# Patient Record
Sex: Male | Born: 1945 | Marital: Married | Smoking: Former smoker
Health system: Southern US, Community
[De-identification: ages and names within clinical notes are randomized; demographics above are authoritative.]

## PROBLEM LIST (undated history)

## (undated) DIAGNOSIS — E785 Hyperlipidemia, unspecified: Secondary | ICD-10-CM

## (undated) DIAGNOSIS — I519 Heart disease, unspecified: Secondary | ICD-10-CM

## (undated) DIAGNOSIS — I10 Essential (primary) hypertension: Secondary | ICD-10-CM

## (undated) HISTORY — PX: ELBOW SURGERY: SHX618

## (undated) HISTORY — PX: ANKLE SURGERY: SHX546

## (undated) HISTORY — DX: Heart disease, unspecified: I51.9

## (undated) HISTORY — DX: Essential (primary) hypertension: I10

## (undated) HISTORY — DX: Hyperlipidemia, unspecified: E78.5

---

## 2001-02-15 HISTORY — PX: ANGIOPLASTY: SHX39

## 2012-06-29 LAB — LIPID PANEL: Triglycerides: 88 mg/dL (ref 40–160)

## 2012-07-14 ENCOUNTER — Ambulatory Visit (INDEPENDENT_AMBULATORY_CARE_PROVIDER_SITE_OTHER): Payer: BC Managed Care – PPO | Admitting: Sports Medicine

## 2012-07-14 ENCOUNTER — Encounter: Payer: Self-pay | Admitting: Sports Medicine

## 2012-07-14 ENCOUNTER — Ambulatory Visit (INDEPENDENT_AMBULATORY_CARE_PROVIDER_SITE_OTHER): Payer: BC Managed Care – PPO

## 2012-07-14 VITALS — BP 127/82 | HR 71 | Wt 186.0 lb

## 2012-07-14 DIAGNOSIS — R7401 Elevation of levels of liver transaminase levels: Secondary | ICD-10-CM

## 2012-07-14 DIAGNOSIS — Z299 Encounter for prophylactic measures, unspecified: Secondary | ICD-10-CM | POA: Insufficient documentation

## 2012-07-14 DIAGNOSIS — M542 Cervicalgia: Secondary | ICD-10-CM

## 2012-07-14 DIAGNOSIS — R7402 Elevation of levels of lactic acid dehydrogenase (LDH): Secondary | ICD-10-CM

## 2012-07-14 DIAGNOSIS — E785 Hyperlipidemia, unspecified: Secondary | ICD-10-CM

## 2012-07-14 DIAGNOSIS — Z9861 Coronary angioplasty status: Secondary | ICD-10-CM

## 2012-07-14 DIAGNOSIS — Z955 Presence of coronary angioplasty implant and graft: Secondary | ICD-10-CM | POA: Insufficient documentation

## 2012-07-14 LAB — COMPREHENSIVE METABOLIC PANEL
ALT: 49 U/L (ref 0–53)
Albumin: 4.5 g/dL (ref 3.5–5.2)
CO2: 25 mEq/L (ref 19–32)
Calcium: 9.3 mg/dL (ref 8.4–10.5)
Chloride: 105 mEq/L (ref 96–112)
Glucose, Bld: 74 mg/dL (ref 70–99)
Potassium: 4.5 mEq/L (ref 3.5–5.3)
Sodium: 138 mEq/L (ref 135–145)
Total Bilirubin: 0.5 mg/dL (ref 0.3–1.2)
Total Protein: 7.1 g/dL (ref 6.0–8.3)

## 2012-07-14 LAB — CBC
HCT: 41.8 % (ref 39.0–52.0)
Hemoglobin: 14.5 g/dL (ref 13.0–17.0)
MCH: 31.5 pg (ref 26.0–34.0)
MCHC: 34.7 g/dL (ref 30.0–36.0)
MCV: 90.7 fL (ref 78.0–100.0)
Platelets: 207 K/uL (ref 150–400)
RBC: 4.61 MIL/uL (ref 4.22–5.81)
RDW: 13 % (ref 11.5–15.5)
WBC: 5.5 10*3/uL (ref 4.0–10.5)

## 2012-07-14 LAB — HEMOGLOBIN A1C
Hgb A1c MFr Bld: 5.8 % — ABNORMAL HIGH (ref <5.7)
Mean Plasma Glucose: 120 mg/dL — ABNORMAL HIGH (ref <117)

## 2012-07-14 LAB — COMPREHENSIVE METABOLIC PANEL WITH GFR
AST: 63 U/L — ABNORMAL HIGH (ref 0–37)
Alkaline Phosphatase: 60 U/L (ref 39–117)
BUN: 19 mg/dL (ref 6–23)
Creat: 1.14 mg/dL (ref 0.50–1.35)

## 2012-07-14 LAB — TSH: TSH: 1.632 u[IU]/mL (ref 0.350–4.500)

## 2012-07-14 MED ORDER — PREDNISONE 50 MG PO TABS: ORAL_TABLET | ORAL | Status: DC

## 2012-07-14 MED ORDER — CYCLOBENZAPRINE HCL 10 MG PO TABS: ORAL_TABLET | ORAL | Status: DC

## 2012-07-14 MED ORDER — MELOXICAM 15 MG PO TABS: ORAL_TABLET | ORAL | Status: AC

## 2012-07-14 NOTE — Progress Notes (Signed)
  Subjective:    CC: Establish care.   HPI:  Neck pain: Present for 6 months after lifting weights, pain is localized in the right side of the mid cervical spine, radiating to the right upper shoulder. Pain is worse with flexion, and does wake him up at night. He denies any bowel or bladder dysfunction. Denies any numbness and tingling in the hands or fingers. Symptoms are moderate, stable. Has not tried anything but ibuprofen.  Hyperlipidemia: Doing well with Crestor.  Transaminitis: Approximately 2 weeks ago, noted a small increase in his ALT and AST.  Past medical history, Surgical history, Family history not pertinant except as noted below, Social history, Allergies, and medications have been entered into the medical record, reviewed, and no changes needed.   Review of Systems: No headache, visual changes, nausea, vomiting, diarrhea, constipation, dizziness, abdominal pain, skin rash, fevers, chills, night sweats, swollen lymph nodes, weight loss, chest pain, body aches, joint swelling, muscle aches, shortness of breath, mood changes, visual or auditory hallucinations.  Objective:    General: Well Developed, well nourished, and in no acute distress.  Neuro: Alert and oriented x3, extra-ocular muscles intact, sensation grossly intact.  HEENT: Normocephalic, atraumatic, pupils equal round reactive to light, neck supple, no masses, no lymphadenopathy, thyroid nonpalpable.  Skin: Warm and dry, no rashes noted.  Cardiac: Regular rate and rhythm, no murmurs rubs or gallops.  Respiratory: Clear to auscultation bilaterally. Not using accessory muscles, speaking in full sentences.  Abdominal: Soft, nontender, nondistended, positive bowel sounds, no masses, no organomegaly.  Neck: Inspection unremarkable. No palpable stepoffs. Spurling's maneuver is painful but does not reproduce any radicular symptoms. Full neck range of motion Grip strength and sensation normal in bilateral hands Strength  good C4 to T1 distribution No sensory change to C4 to T1 Negative Hoffman sign bilaterally Reflexes normal Impression and Recommendations:    The patient was counselled, risk factors were discussed, anticipatory guidance given.

## 2012-07-14 NOTE — Assessment & Plan Note (Signed)
Likely discogenic after an episode of lifting weights approximately 6 months ago. He does radiate to the right upper shoulder but not past. Prednisone, home exercises, x-rays, Flexeril at night. Mobic. Return in 4 weeks, MRI if no better.

## 2012-07-14 NOTE — Assessment & Plan Note (Signed)
Continue Crestor, most recent lipids were completely well controlled.

## 2012-07-14 NOTE — Assessment & Plan Note (Signed)
Checking some routine blood work. He will schedule a preventive visit.

## 2012-07-14 NOTE — Assessment & Plan Note (Signed)
Rechecking LFTs

## 2012-07-15 LAB — VITAMIN D 25 HYDROXY (VIT D DEFICIENCY, FRACTURES): Vit D, 25-Hydroxy: 45 ng/mL (ref 30–89)

## 2012-07-17 LAB — TESTOSTERONE, FREE, TOTAL, SHBG
Sex Hormone Binding: 39 nmol/L (ref 13–71)
Testosterone, Free: 60.1 pg/mL (ref 47.0–244.0)
Testosterone-% Free: 1.8 % (ref 1.6–2.9)
Testosterone: 335 ng/dL (ref 300–890)

## 2012-07-17 NOTE — Addendum Note (Signed)
Addended by: Monica Becton on: 07/17/2012 10:49 AM   Modules accepted: Orders

## 2012-07-18 ENCOUNTER — Encounter: Payer: Self-pay | Admitting: *Deleted

## 2012-07-18 LAB — HEPATITIS PANEL, ACUTE
HCV Ab: NEGATIVE
Hep A IgM: NEGATIVE
Hep B C IgM: NEGATIVE
Hepatitis B Surface Ag: NEGATIVE

## 2012-07-27 ENCOUNTER — Encounter: Payer: Self-pay | Admitting: Sports Medicine

## 2012-07-27 ENCOUNTER — Ambulatory Visit (INDEPENDENT_AMBULATORY_CARE_PROVIDER_SITE_OTHER): Payer: BC Managed Care – PPO | Admitting: Sports Medicine

## 2012-07-27 VITALS — BP 123/84 | HR 67 | Wt 184.0 lb

## 2012-07-27 DIAGNOSIS — Z955 Presence of coronary angioplasty implant and graft: Secondary | ICD-10-CM

## 2012-07-27 DIAGNOSIS — Z299 Encounter for prophylactic measures, unspecified: Secondary | ICD-10-CM

## 2012-07-27 DIAGNOSIS — M542 Cervicalgia: Secondary | ICD-10-CM

## 2012-07-27 DIAGNOSIS — Z23 Encounter for immunization: Secondary | ICD-10-CM

## 2012-07-27 DIAGNOSIS — Z0000 Encounter for general adult medical examination without abnormal findings: Secondary | ICD-10-CM

## 2012-07-27 DIAGNOSIS — R7401 Elevation of levels of liver transaminase levels: Secondary | ICD-10-CM

## 2012-07-27 NOTE — Assessment & Plan Note (Signed)
We'll be following up in approximately 2 weeks for cervical degenerative disc disease.

## 2012-07-27 NOTE — Assessment & Plan Note (Signed)
He will followup with his cardiologist in [facility] regarding further evaluation of this. He is asymptomatic.

## 2012-07-27 NOTE — Assessment & Plan Note (Signed)
Is likely due to Crestor. Clinically asymptomatic we will recheck his liver function approximately 6 months.

## 2012-07-27 NOTE — Assessment & Plan Note (Signed)
Complete physical performed today, Hemoccult was negative. Needs referral to colonoscopy.

## 2012-07-27 NOTE — Progress Notes (Signed)
  Subjective:    CC: Establish care.   HPI:  Due for a colonoscopy, his last one was 3 years ago, polyps were seen, and he needs 3 her colonoscopies.  Due for a Pneumovax, unsure of when his last tetanus shot was.  Transaminitis: Completely asymptomatic, no nausea, yellowing of eyes, right upper quadrant pain.  Neck pain: Still has a couple more weeks of conservative measures. Overall doing much better.  Hyperlipidemia: Stable on Crestor.  Coronary artery disease status post stent placement: Does have a followup coming up with his cardiologist, no chest pain, no palpitations, no dyspnea.  Past medical history, Surgical history, Family history not pertinant except as noted below, Social history, Allergies, and medications have been entered into the medical record, reviewed, and no changes needed.   Review of Systems: No headache, visual changes, nausea, vomiting, diarrhea, constipation, dizziness, abdominal pain, skin rash, fevers, chills, night sweats, swollen lymph nodes, weight loss, chest pain, body aches, joint swelling, muscle aches, shortness of breath, mood changes, visual or auditory hallucinations.  Objective:    General: Well Developed, well nourished, and in no acute distress.  Neuro: Alert and oriented x3, extra-ocular muscles intact, sensation grossly intact.  HEENT: Normocephalic, atraumatic, pupils equal round reactive to light, neck supple, no masses, no lymphadenopathy, thyroid nonpalpable.  Skin: Warm and dry, no rashes noted.  Cardiac: Regular rate and rhythm, no murmurs rubs or gallops.  Respiratory: Clear to auscultation bilaterally. Not using accessory muscles, speaking in full sentences.  Abdominal: Soft, nontender, nondistended, positive bowel sounds, no masses, no organomegaly.  Musculoskeletal: Shoulder, elbow, wrist, hip, knee, ankle stable, and with full range of motion. Rectal: Good tone, prostate was smooth, Hemoccult negative. Impression and  Recommendations:    The patient was counselled, risk factors were discussed, anticipatory guidance given.

## 2012-08-10 ENCOUNTER — Ambulatory Visit (INDEPENDENT_AMBULATORY_CARE_PROVIDER_SITE_OTHER): Payer: BC Managed Care – PPO | Admitting: Sports Medicine

## 2012-08-10 ENCOUNTER — Encounter: Payer: Self-pay | Admitting: Internal Medicine

## 2012-08-10 ENCOUNTER — Encounter: Payer: Self-pay | Admitting: Sports Medicine

## 2012-08-10 VITALS — BP 140/89 | HR 70 | Wt 184.0 lb

## 2012-08-10 DIAGNOSIS — Z299 Encounter for prophylactic measures, unspecified: Secondary | ICD-10-CM

## 2012-08-10 DIAGNOSIS — M542 Cervicalgia: Secondary | ICD-10-CM

## 2012-08-10 MED ORDER — TRAMADOL HCL 50 MG PO TABS: 50.0000 mg | ORAL_TABLET | Freq: Three times a day (TID) | ORAL | Status: AC | PRN

## 2012-08-10 NOTE — Progress Notes (Signed)
  Subjective:    CC: Followup  HPI: Neck pain: Likely due to cervical degenerative disc disease as well as facet spondylosis, pain is resolved after formal physical therapy, and other conservative measures. He will continue home exercises, and is very happy with results so far.  He feels as though he has not plateaued either.  Preventative measures: Due for a screening colonoscopy, [facility] did attempt to contact him.  Past medical history, Surgical history, Family history not pertinant except as noted below, Social history, Allergies, and medications have been entered into the medical record, reviewed, and no changes needed.   Review of Systems: No fevers, chills, night sweats, weight loss, chest pain, or shortness of breath.   Objective:    General: Well Developed, well nourished, and in no acute distress.  Neuro: Alert and oriented x3, extra-ocular muscles intact, sensation grossly intact.  HEENT: Normocephalic, atraumatic, pupils equal round reactive to light, neck supple, no masses, no lymphadenopathy, thyroid nonpalpable.  Skin: Warm and dry, no rashes. Cardiac: Regular rate and rhythm, no murmurs rubs or gallops, no lower extremity edema.  Respiratory: Clear to auscultation bilaterally. Not using accessory muscles, speaking in full sentences. Neck: Inspection unremarkable. No palpable stepoffs. Negative Spurling's maneuver. Full neck range of motion Grip strength and sensation normal in bilateral hands Strength good C4 to T1 distribution No sensory change to C4 to T1 Negative Hoffman sign bilaterally Reflexes normal Impression and Recommendations:

## 2012-08-10 NOTE — Assessment & Plan Note (Signed)
Resolved with conservative measures. No further intervention with the exception of adding tramadol as needed. Return to see me on an as needed basis for this.

## 2012-08-10 NOTE — Assessment & Plan Note (Signed)
Due for colonoscopy. Referral was placed, and he was attempted to be contacted. I will have him followup with [facility] gastroenterology regarding this.

## 2012-10-10 ENCOUNTER — Encounter: Payer: BC Managed Care – PPO | Admitting: Internal Medicine

## 2012-12-15 ENCOUNTER — Encounter: Payer: Self-pay | Admitting: Sports Medicine

## 2013-01-23 ENCOUNTER — Telehealth: Payer: Self-pay

## 2013-01-23 NOTE — Telephone Encounter (Signed)
Patient anted lab results faxed to cardiologist. Results have been faxed to [facility] Cardiologist. Cameron Davila

## 2013-02-12 ENCOUNTER — Encounter: Payer: Self-pay | Admitting: Sports Medicine

## 2013-02-12 ENCOUNTER — Ambulatory Visit (INDEPENDENT_AMBULATORY_CARE_PROVIDER_SITE_OTHER): Payer: BC Managed Care – PPO | Admitting: Sports Medicine

## 2013-02-12 VITALS — BP 135/85 | HR 76 | Wt 194.0 lb

## 2013-02-12 DIAGNOSIS — M542 Cervicalgia: Secondary | ICD-10-CM

## 2013-02-12 DIAGNOSIS — R7401 Elevation of levels of liver transaminase levels: Secondary | ICD-10-CM

## 2013-02-12 DIAGNOSIS — R7402 Elevation of levels of lactic acid dehydrogenase (LDH): Secondary | ICD-10-CM

## 2013-02-12 DIAGNOSIS — E785 Hyperlipidemia, unspecified: Secondary | ICD-10-CM

## 2013-02-12 LAB — COMPREHENSIVE METABOLIC PANEL
BUN: 18 mg/dL (ref 6–23)
CO2: 26 mEq/L (ref 19–32)
Calcium: 9.2 mg/dL (ref 8.4–10.5)
Chloride: 103 mEq/L (ref 96–112)
Creat: 1.08 mg/dL (ref 0.50–1.35)

## 2013-02-12 LAB — COMPREHENSIVE METABOLIC PANEL WITH GFR
ALT: 47 U/L (ref 0–53)
AST: 42 U/L — ABNORMAL HIGH (ref 0–37)
Albumin: 4.1 g/dL (ref 3.5–5.2)
Alkaline Phosphatase: 72 U/L (ref 39–117)
Glucose, Bld: 117 mg/dL — ABNORMAL HIGH (ref 70–99)
Potassium: 4.3 meq/L (ref 3.5–5.3)
Sodium: 137 meq/L (ref 135–145)
Total Bilirubin: 0.4 mg/dL (ref 0.3–1.2)
Total Protein: 6.6 g/dL (ref 6.0–8.3)

## 2013-02-12 MED ORDER — ATORVASTATIN CALCIUM 40 MG PO TABS: 40.0000 mg | ORAL_TABLET | Freq: Every day | ORAL | Status: AC

## 2013-02-12 NOTE — Assessment & Plan Note (Signed)
Switching to Lipitor.

## 2013-02-12 NOTE — Progress Notes (Signed)
  Subjective:    CC: Followup  HPI: Transaminitis: Asymptomatic, plan was to recheck in 6 months. This was likely due to Crestor.  Hyperlipidemia: Crestor is now going up to over $100 a month, he does desire to switch to something cheaper.  Neck pain: due to cervical degenerative disc disease, resolved with conservative measures.  Past medical history, Surgical history, Family history not pertinant except as noted below, Social history, Allergies, and medications have been entered into the medical record, reviewed, and no changes needed.   Review of Systems: No fevers, chills, night sweats, weight loss, chest pain, or shortness of breath.   Objective:    General: Well Developed, well nourished, and in no acute distress.  Neuro: Alert and oriented x3, extra-ocular muscles intact, sensation grossly intact.  HEENT: Normocephalic, atraumatic, pupils equal round reactive to light, neck supple, no masses, no lymphadenopathy, thyroid nonpalpable.  Skin: Warm and dry, no rashes. Cardiac: Regular rate and rhythm, no murmurs rubs or gallops, no lower extremity edema.  Respiratory: Clear to auscultation bilaterally. Not using accessory muscles, speaking in full sentences. Neck: Inspection unremarkable. No palpable stepoffs. Negative Spurling's maneuver. Full neck range of motion Grip strength and sensation normal in bilateral hands Strength good C4 to T1 distribution No sensory change to C4 to T1 Negative Hoffman sign bilaterally Reflexes normal Impression and Recommendations:

## 2013-02-12 NOTE — Assessment & Plan Note (Signed)
Resolved with conservative measures. 

## 2013-02-12 NOTE — Assessment & Plan Note (Signed)
Likely due to Crestor. Rechecking CMET. Return in 3 months to recheck lipids.

## 2013-03-18 ENCOUNTER — Emergency Department (INDEPENDENT_AMBULATORY_CARE_PROVIDER_SITE_OTHER)
Admission: EM | Admit: 2013-03-18 | Discharge: 2013-03-18 | Disposition: A | Discharge status: Home / Self Care | Payer: Federal, State, Local not specified - PPO | Source: Home / Self Care | Attending: Family Medicine | Admitting: Family Medicine

## 2013-03-18 ENCOUNTER — Encounter: Payer: Self-pay | Admitting: Emergency Medicine

## 2013-03-18 ENCOUNTER — Emergency Department (INDEPENDENT_AMBULATORY_CARE_PROVIDER_SITE_OTHER): Payer: BC Managed Care – PPO

## 2013-03-18 DIAGNOSIS — M25572 Pain in left ankle and joints of left foot: Secondary | ICD-10-CM

## 2013-03-18 DIAGNOSIS — M25579 Pain in unspecified ankle and joints of unspecified foot: Secondary | ICD-10-CM

## 2013-03-18 MED ORDER — PREDNISONE 20 MG PO TABS: 20.0000 mg | ORAL_TABLET | Freq: Two times a day (BID) | ORAL | Status: AC

## 2013-03-18 MED ORDER — HYDROCODONE-ACETAMINOPHEN 5-325 MG PO TABS
ORAL_TABLET | ORAL | Status: AC
Start: 2013-03-18 — End: ?

## 2013-03-18 NOTE — ED Notes (Signed)
Regan RakersRoberto complains of swelling and pain in his left ankle for 4 days. Denies injury. Pain is a sharp pain with movement and is a 5/10.

## 2013-03-18 NOTE — ED Provider Notes (Addendum)
CSN: [id number]     Arrival date & time 03/18/13  1204 History   First MD Initiated Contact with Patient 03/18/13 1220     Chief Complaint  Patient presents with  . Joint Swelling    x 4 days  . Ankle Pain    x 4 days      HPI Comments: Patient complains of onset of pain, swelling, and redness of his left lateral ankle four days ago.  He has pain with weight bearing.  No fevers, chills, and sweats.  He recalls no recent injury or change in activities.  No history of gout.  Past history of left ankle fracture 10 years ago with ORIF.  Patient is a 68 y.o. male presenting with ankle pain. The history is provided by the patient.  Ankle Pain Location:  Ankle Time since incident:  4 days Injury: no   Ankle location:  L ankle Pain details:    Quality:  Aching   Radiates to:  Does not radiate   Severity:  Moderate   Onset quality:  Sudden   Duration:  4 days   Timing:  Constant   Progression:  Worsening Chronicity:  New Dislocation: no   Prior injury to area:  Yes Relieved by:  Nothing Worsened by:  Bearing weight Ineffective treatments:  Compression Associated symptoms: decreased ROM, stiffness and swelling   Associated symptoms: no back pain, no fatigue, no fever, no muscle weakness, no numbness and no tingling     Past Medical History  Diagnosis Date  . Heart disease   . Hypertension   . Hyperlipidemia    Past Surgical History  Procedure Laterality Date  . Angioplasty  2003  . Elbow surgery    . Ankle surgery     Family History  Problem Relation Age of Onset  . Cancer Mother   . Depression Maternal Aunt   . Hypertension Paternal Uncle   . Stroke Paternal Uncle   . Heart disease Paternal Grandfather   . Hyperlipidemia Paternal Grandfather    History  Substance Use Topics  . Smoking status: Former Games developermoker  . Smokeless tobacco: Not on file  . Alcohol Use: Yes    Review of Systems  Constitutional: Negative for fever and fatigue.  Musculoskeletal: Positive for  stiffness. Negative for back pain.  All other systems reviewed and are negative.    Allergies  Review of patient's allergies indicates no known allergies.  Home Medications   Current Outpatient Rx  Name  Route  Sig  Dispense  Refill  . Ascorbic Acid (VITAMIN C) 1000 MG tablet   Oral   Take 1,000 mg by mouth daily.         Marland Kitchen. aspirin 81 MG chewable tablet   Oral   Chew 81 mg by mouth daily.         Marland Kitchen. atorvastatin (LIPITOR) 40 MG tablet   Oral   Take 1 tablet (40 mg total) by mouth daily.   90 tablet   3   . ezetimibe (ZETIA) 10 MG tablet   Oral   Take 10 mg by mouth daily.         Marland Kitchen. HYDROcodone-acetaminophen (NORCO/VICODIN) 5-325 MG per tablet      Take one by mouth at bedtime as needed for pain   7 tablet   0   . lisinopril (PRINIVIL,ZESTRIL) 20 MG tablet   Oral   Take 20 mg by mouth daily.         . meloxicam (MOBIC)  15 MG tablet      One tab PO qAM with breakfast for 2 weeks, then daily prn pain.   30 tablet   3   . metoprolol (LOPRESSOR) 50 MG tablet   Oral   Take 50 mg by mouth daily.         . Multiple Vitamin (MULTIVITAMIN) tablet   Oral   Take 1 tablet by mouth daily.         . predniSONE (DELTASONE) 20 MG tablet   Oral   Take 1 tablet (20 mg total) by mouth 2 (two) times daily. Take with food.   10 tablet   0   . traMADol (ULTRAM) 50 MG tablet   Oral   Take 1 tablet (50 mg total) by mouth every 8 (eight) hours as needed for pain.   50 tablet   2   . vitamin E (VITAMIN E) 400 UNIT capsule   Oral   Take 400 Units by mouth daily.          BP 131/81  Pulse 99  Temp(Src) 98.6 F (37 C) (Oral)  Ht 5\' 5"  (1.651 m)  Wt 185 lb (83.915 kg)  BMI 30.79 kg/m2  SpO2 98% Physical Exam  Nursing note and vitals reviewed. Constitutional: He is oriented to person, place, and time. He appears well-developed and well-nourished. No distress.  HENT:  Head: Normocephalic.  Eyes: Conjunctivae are normal. Pupils are equal, round, and  reactive to light.  Cardiovascular: Normal heart sounds.   Pulmonary/Chest: Breath sounds normal.  Musculoskeletal:       Left ankle: He exhibits decreased range of motion and swelling. He exhibits no ecchymosis, no deformity, no laceration and normal pulse. Tenderness. Lateral malleolus tenderness found. No medial malleolus and no proximal fibula tenderness found. Achilles tendon normal.       Feet:  Left lateral malleolus reveals tenderness and swelling, with mild erythema and warmth.  Neurological: He is alert and oriented to person, place, and time.  Skin: Skin is warm and dry.    ED Course  Procedures  none    Labs Reviewed  CBC WITH DIFFERENTIAL  URIC ACID  SEDIMENTATION RATE   Imaging Review Dg Ankle Complete Left  03/18/2013   CLINICAL DATA:  Left ankle pain  EXAM: LEFT ANKLE COMPLETE - 3+ VIEW  COMPARISON:  None.  FINDINGS: There is no acute fracture or dislocation. The ankle mortise is intact. There is a small marginal osteophyte along the anterior lip of the distal tibia. There is severe soft tissue swelling overlying the lateral malleolus.  IMPRESSION: No acute osseous injury of the left ankle.   Electronically Signed   By: Elige Ko   On: 03/18/2013 12:46      MDM   1. Ankle pain, left; suspect gout   Begin prednisone burst.  Lortab at bedtime for pain Uric acid, CBC, and sed rate pending Elevate leg as much as possible.  Increase fluid intake Followup with Dr. Rodney Langton in 5 days.    Lattie Haw, MD 03/19/13 1925  Addendum: Uric Acid, CBC, and sed rate were inadvertently not obtained during patient's visit.  He will return 03/20/13 for lab testing. He reports that his ankle is improving  Lattie Haw, MD 03/19/13 [phone]

## 2013-03-18 NOTE — Discharge Instructions (Signed)
Elevate leg as much as possible.  Increase fluid intake

## 2013-03-19 ENCOUNTER — Telehealth: Payer: Self-pay | Admitting: Emergency Medicine

## 2013-03-20 ENCOUNTER — Emergency Department (INDEPENDENT_AMBULATORY_CARE_PROVIDER_SITE_OTHER)
Admission: EM | Admit: 2013-03-20 | Discharge: 2013-03-20 | Disposition: A | Discharge status: Home / Self Care | Payer: Federal, State, Local not specified - PPO | Source: Home / Self Care

## 2013-03-20 DIAGNOSIS — M25579 Pain in unspecified ankle and joints of unspecified foot: Secondary | ICD-10-CM

## 2013-03-20 LAB — CBC WITH DIFFERENTIAL/PLATELET
BASOS PCT: 0 % (ref 0–1)
Basophils Absolute: 0 10*3/uL (ref 0.0–0.1)
EOS ABS: 0 10*3/uL (ref 0.0–0.7)
Eosinophils Relative: 0 % (ref 0–5)
HCT: 36.5 % — ABNORMAL LOW (ref 39.0–52.0)
HEMOGLOBIN: 13 g/dL (ref 13.0–17.0)
Lymphocytes Relative: 16 % (ref 12–46)
Lymphs Abs: 1.4 10*3/uL (ref 0.7–4.0)
MCH: 32.2 pg (ref 26.0–34.0)
MCHC: 35.6 g/dL (ref 30.0–36.0)
MCV: 90.3 fL (ref 78.0–100.0)
MONOS PCT: 9 % (ref 3–12)
Monocytes Absolute: 0.8 10*3/uL (ref 0.1–1.0)
NEUTROS ABS: 6.9 10*3/uL (ref 1.7–7.7)
NEUTROS PCT: 75 % (ref 43–77)
PLATELETS: 210 10*3/uL (ref 150–400)
RBC: 4.04 MIL/uL — ABNORMAL LOW (ref 4.22–5.81)
RDW: 12.8 % (ref 11.5–15.5)
WBC: 9.1 10*3/uL (ref 4.0–10.5)

## 2013-03-20 LAB — URIC ACID: URIC ACID, SERUM: 6.9 mg/dL (ref 4.0–7.8)

## 2013-03-20 LAB — SEDIMENTATION RATE: SED RATE: 22 mm/h — AB (ref 0–16)

## 2013-03-20 NOTE — ED Notes (Signed)
The pt is here today for a blood draw.

## 2013-03-22 ENCOUNTER — Encounter: Payer: Self-pay | Admitting: Sports Medicine

## 2013-03-22 ENCOUNTER — Ambulatory Visit (INDEPENDENT_AMBULATORY_CARE_PROVIDER_SITE_OTHER): Payer: BC Managed Care – PPO | Admitting: Sports Medicine

## 2013-03-22 VITALS — BP 134/80 | HR 68 | Ht 66.0 in | Wt 195.0 lb

## 2013-03-22 DIAGNOSIS — M7750 Other enthesopathy of unspecified foot: Secondary | ICD-10-CM

## 2013-03-22 DIAGNOSIS — M7672 Peroneal tendinitis, left leg: Secondary | ICD-10-CM | POA: Insufficient documentation

## 2013-03-22 NOTE — Assessment & Plan Note (Signed)
Improved significantly with prednisone, continues to have localized pain with reproduction of pain with resisted eversion of the ankle. Guided injection into the peroneal tendon sheath, ankle is strapped with compressive dressing. Home rehabilitation exercises given. Return in one month.

## 2013-03-22 NOTE — Progress Notes (Signed)
   Subjective:    I'm seeing this patient as a consultation for:  Dr. Cathren HarshBeese  CC: Left ankle pain  HPI: This is a very pleasant 68 year old male, recently he had a flare in pain on the left ankle behind the lateral malleolus, there was swelling, pain, and inability to evert the ankle. He was seen in urgent care, prescribed prednisone and referred to me for further evaluation and definitive treatment. Pain is significantly improved but he still has some discomfort he localizes over the peroneal tendons on the left side just around the inferior aspect of the lateral malleolus. Pain is moderate, persistent and keeps him from exercising.  Past medical history, Surgical history, Family history not pertinant except as noted below, Social history, Allergies, and medications have been entered into the medical record, reviewed, and no changes needed.   Review of Systems: No headache, visual changes, nausea, vomiting, diarrhea, constipation, dizziness, abdominal pain, skin rash, fevers, chills, night sweats, weight loss, swollen lymph nodes, body aches, joint swelling, muscle aches, chest pain, shortness of breath, mood changes, visual or auditory hallucinations.   Objective:   General: Well Developed, well nourished, and in no acute distress.  Neuro/Psych: Alert and oriented x3, extra-ocular muscles intact, able to move all 4 extremities, sensation grossly intact. Skin: Warm and dry, no rashes noted.  Respiratory: Not using accessory muscles, speaking in full sentences, trachea midline.  Cardiovascular: Pulses palpable, no extremity edema. Abdomen: Does not appear distended. Left Ankle: No visible erythema or swelling. Range of motion is full in all directions. Strength is 5/5 in all directions. Stable lateral and medial ligaments; squeeze test and kleiger test unremarkable; Talar dome nontender; No pain at base of 5th MT; No tenderness over cuboid; No tenderness over N spot or navicular  prominence Tender to palpation on the lateral malleolus with reproduction of pain with resisted ankle eversion with the foot in a dorsiflexed position Negative tarsal tunnel tinel's Able to walk 4 steps.  Procedure: Real-time Ultrasound Guided Injection of left peroneal tendon sheath Device: GE Logiq E  Verbal informed consent obtained.  Time-out conducted.  Noted no overlying erythema, induration, or other signs of local infection.  Skin prepped in a sterile fashion.  Local anesthesia: Topical Ethyl chloride.  With sterile technique and under real time ultrasound guidance:  Noted a small amount of fluid in the peroneal tendon sheath, needle advanced between the peroneus longus and brevis tendons, 1 cc Kenalog 40, 3 cc lidocaine injected easily. Completed without difficulty  Pain immediately resolved suggesting accurate placement of the medication.  Advised to call if fevers/chills, erythema, induration, drainage, or persistent bleeding.  Images permanently stored and available for review in the ultrasound unit.  Impression: Technically successful ultrasound guided injection.  The ankle was then strapped with compressive dressing.  Impression and Recommendations:   This case required medical decision making of moderate complexity.

## 2013-04-19 ENCOUNTER — Ambulatory Visit: Payer: Federal, State, Local not specified - PPO | Admitting: Sports Medicine

## 2013-04-19 DIAGNOSIS — Z0289 Encounter for other administrative examinations: Secondary | ICD-10-CM

## 2013-05-14 ENCOUNTER — Ambulatory Visit (INDEPENDENT_AMBULATORY_CARE_PROVIDER_SITE_OTHER): Payer: Federal, State, Local not specified - PPO | Admitting: Sports Medicine

## 2013-05-14 ENCOUNTER — Encounter: Payer: Self-pay | Admitting: Sports Medicine

## 2013-05-14 VITALS — BP 123/77 | HR 72 | Ht 65.0 in | Wt 196.0 lb

## 2013-05-14 DIAGNOSIS — M7672 Peroneal tendinitis, left leg: Secondary | ICD-10-CM

## 2013-05-14 DIAGNOSIS — R740 Nonspecific elevation of levels of transaminase and lactic acid dehydrogenase [LDH]: Principal | ICD-10-CM

## 2013-05-14 DIAGNOSIS — R7402 Elevation of levels of lactic acid dehydrogenase (LDH): Secondary | ICD-10-CM

## 2013-05-14 DIAGNOSIS — R7401 Elevation of levels of liver transaminase levels: Secondary | ICD-10-CM

## 2013-05-14 DIAGNOSIS — M542 Cervicalgia: Secondary | ICD-10-CM

## 2013-05-14 DIAGNOSIS — M7750 Other enthesopathy of unspecified foot: Secondary | ICD-10-CM

## 2013-05-14 DIAGNOSIS — E785 Hyperlipidemia, unspecified: Secondary | ICD-10-CM

## 2013-05-14 NOTE — Assessment & Plan Note (Signed)
Rechecking LFTs, suspicion was that this was due to Crestor. We did switch to Lipitor.

## 2013-05-14 NOTE — Assessment & Plan Note (Signed)
Rechecking lipids on atorvastatin

## 2013-05-14 NOTE — Assessment & Plan Note (Signed)
Resolved with conservative measures. 

## 2013-05-14 NOTE — Progress Notes (Signed)
  Subjective:    CC: Followup  HPI: Transaminitis: Asymptomatic, suspicion was that this was due to Crestor, we switched to atorvastatin and he is due for a recheck.  Hyperlipidemia: Due for a recheck on atorvastatin.  Neck pain: Resolved.  Peroneal tendinitis: Resolved.  Past medical history, Surgical history, Family history not pertinant except as noted below, Social history, Allergies, and medications have been entered into the medical record, reviewed, and no changes needed.   Review of Systems: No fevers, chills, night sweats, weight loss, chest pain, or shortness of breath.   Objective:    General: Well Developed, well nourished, and in no acute distress.  Neuro: Alert and oriented x3, extra-ocular muscles intact, sensation grossly intact.  HEENT: Normocephalic, atraumatic, pupils equal round reactive to light, neck supple, no masses, no lymphadenopathy, thyroid nonpalpable.  Skin: Warm and dry, no rashes. Cardiac: Regular rate and rhythm, no murmurs rubs or gallops, no lower extremity edema.  Respiratory: Clear to auscultation bilaterally. Not using accessory muscles, speaking in full sentences. Abdomen: Soft, nontender, distended, normal bowel sounds,  No palpable masses. No guarding, no rigidity.  Impression and Recommendations:

## 2013-05-16 LAB — COMPREHENSIVE METABOLIC PANEL
Albumin: 3.9 g/dL (ref 3.5–5.2)
CO2: 29 mEq/L (ref 19–32)
Calcium: 9.1 mg/dL (ref 8.4–10.5)
Glucose, Bld: 94 mg/dL (ref 70–99)
Potassium: 4.5 mEq/L (ref 3.5–5.3)
Sodium: 138 mEq/L (ref 135–145)
Total Bilirubin: 0.5 mg/dL (ref 0.2–1.2)
Total Protein: 6.4 g/dL (ref 6.0–8.3)

## 2013-05-16 LAB — COMPREHENSIVE METABOLIC PANEL WITH GFR
ALT: 31 U/L (ref 0–53)
AST: 31 U/L (ref 0–37)
Alkaline Phosphatase: 65 U/L (ref 39–117)
BUN: 16 mg/dL (ref 6–23)
Chloride: 102 meq/L (ref 96–112)
Creat: 1.2 mg/dL (ref 0.50–1.35)

## 2013-05-16 LAB — LIPID PANEL
Cholesterol: 128 mg/dL (ref 0–200)
HDL: 41 mg/dL (ref >39)
LDL Cholesterol: 69 mg/dL (ref 0–99)
Total CHOL/HDL Ratio: 3.1 ratio
Triglycerides: 91 mg/dL (ref <150)
VLDL: 18 mg/dL (ref 0–40)

## 2013-06-15 ENCOUNTER — Encounter: Payer: Self-pay | Admitting: Sports Medicine

## 2013-06-15 ENCOUNTER — Ambulatory Visit (INDEPENDENT_AMBULATORY_CARE_PROVIDER_SITE_OTHER): Payer: Federal, State, Local not specified - PPO | Admitting: Sports Medicine

## 2013-06-15 VITALS — BP 126/78 | HR 74 | Temp 98.3°F | Ht 66.0 in | Wt 193.0 lb

## 2013-06-15 DIAGNOSIS — J069 Acute upper respiratory infection, unspecified: Secondary | ICD-10-CM

## 2013-06-15 NOTE — Assessment & Plan Note (Addendum)
We will treat this conservatively. Symptoms now are predominantly nasal, adding Nasonex samples. Return as needed

## 2013-06-15 NOTE — Progress Notes (Signed)
Patient ID: Cameron Davila, male   DOB: 12/27/1945, 68 y.o.   MRN: [id number]  Subjective:    CC: congestion x 1 week  HPI:  Patient presents acutely for cough and cold x 1.5 weeks.  He had a viral URI that began a week and a half ago.  He had symptoms of cough, sore throat, fever, chills, and nasal congestion.  Since then the sore throat, fever, and chills have resolved but he has had continued nasal congestion and some cough.  His cough is worse at night and is productive of yellowish sputum.  He has no sinus pain or pressure, only continued nasal discharge with increased need to blow his nose.  He denies ongoing fever, sore throat, chills, n/v, diarrhea, myalgias.  He does endorse increased fatigue.  He has not tried any medications.    Past medical history, Surgical history, Family history not pertinant except as noted below, Social history, Allergies, and medications have been entered into the medical record, reviewed, and no changes needed.   Review of Systems: No fevers, chills, night sweats, weight loss, chest pain, or shortness of breath.   Objective:    General: Well Developed, well nourished, and in no acute distress.  Neuro: Alert and oriented x3, extra-ocular muscles intact, sensation grossly intact.  HEENT: Normocephalic, atraumatic, pupils equal round reactive to light, neck supple, no masses, no lymphadenopathy, thyroid nonpalpable.  Skin: Warm and dry, no rashes. Cardiac: Regular rate and rhythm, no murmurs rubs or gallops, no lower extremity edema.  Respiratory: Clear to auscultation bilaterally. Not using accessory muscles, speaking in full sentences.   Impression and Recommendations:    Patient is likely experiencing ongoing congestion and inflammation of the nasal mucosa with subsequent cough from PND.  He does not have any signs concerning for infection and does not need abx at this time.  Nasal steroids will suffice at this time to control his congestion.  - Nasonex 1  puff each nostril BID

## 2013-11-14 ENCOUNTER — Ambulatory Visit: Payer: Federal, State, Local not specified - PPO | Admitting: Sports Medicine

## 2014-08-08 IMAGING — CR DG ANKLE COMPLETE 3+V*L*
3 series · 3 of 3 positions shown · non-contrast
Comparison: None.

CLINICAL DATA: Left ankle pain

EXAM:
LEFT ANKLE COMPLETE - 3+ VIEW

[view not recorded (1 of 3)]
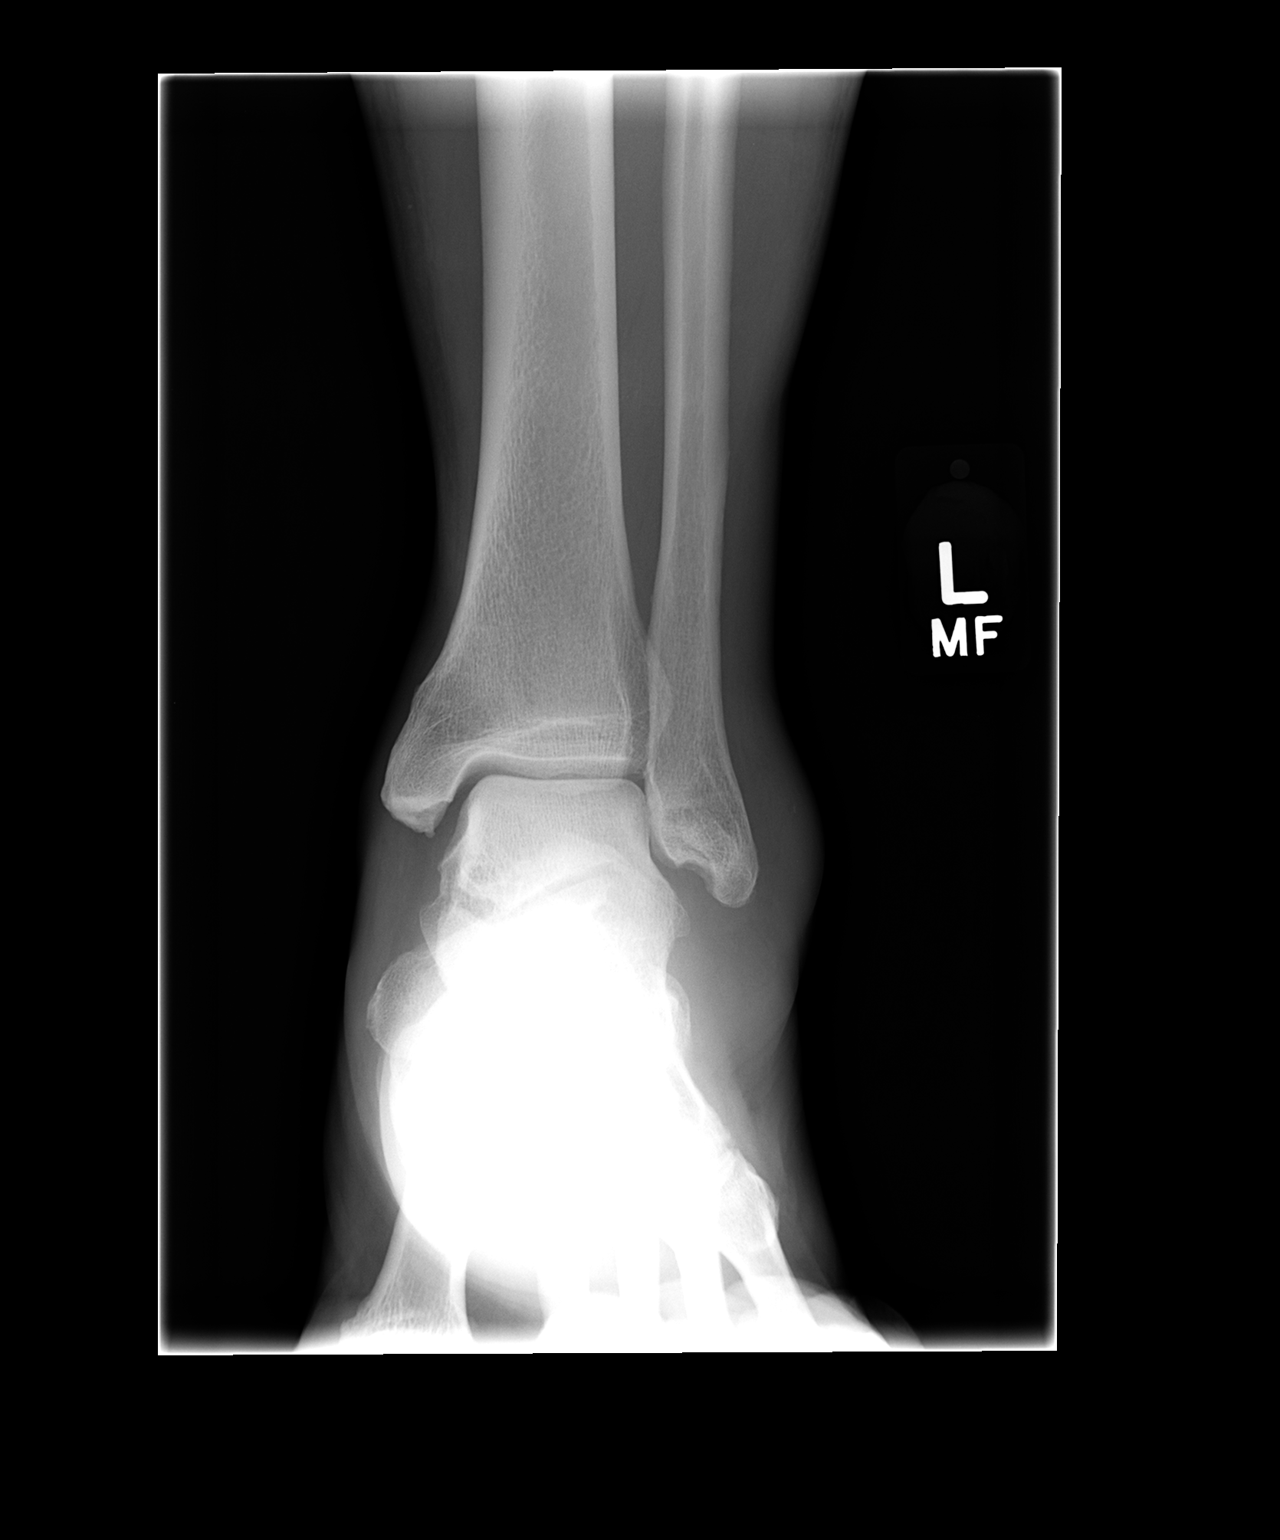

[view not recorded (2 of 3)]
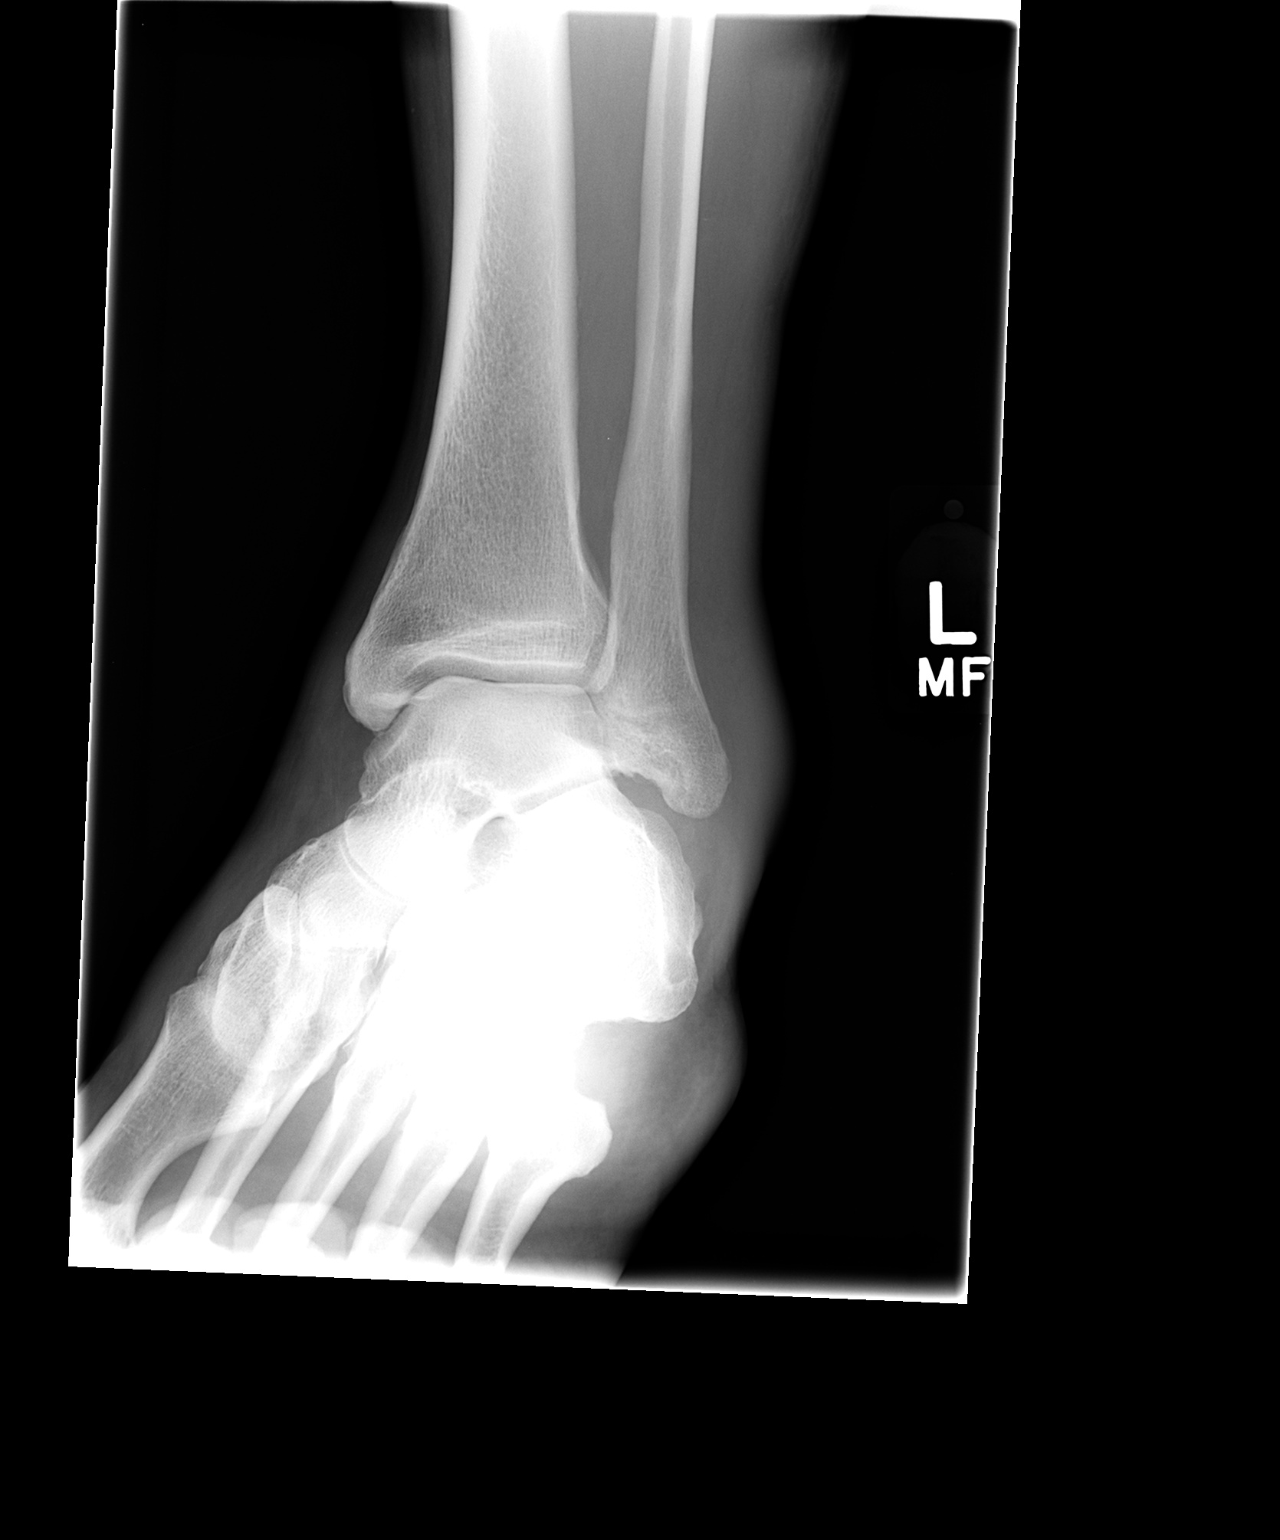

[view not recorded (3 of 3)]
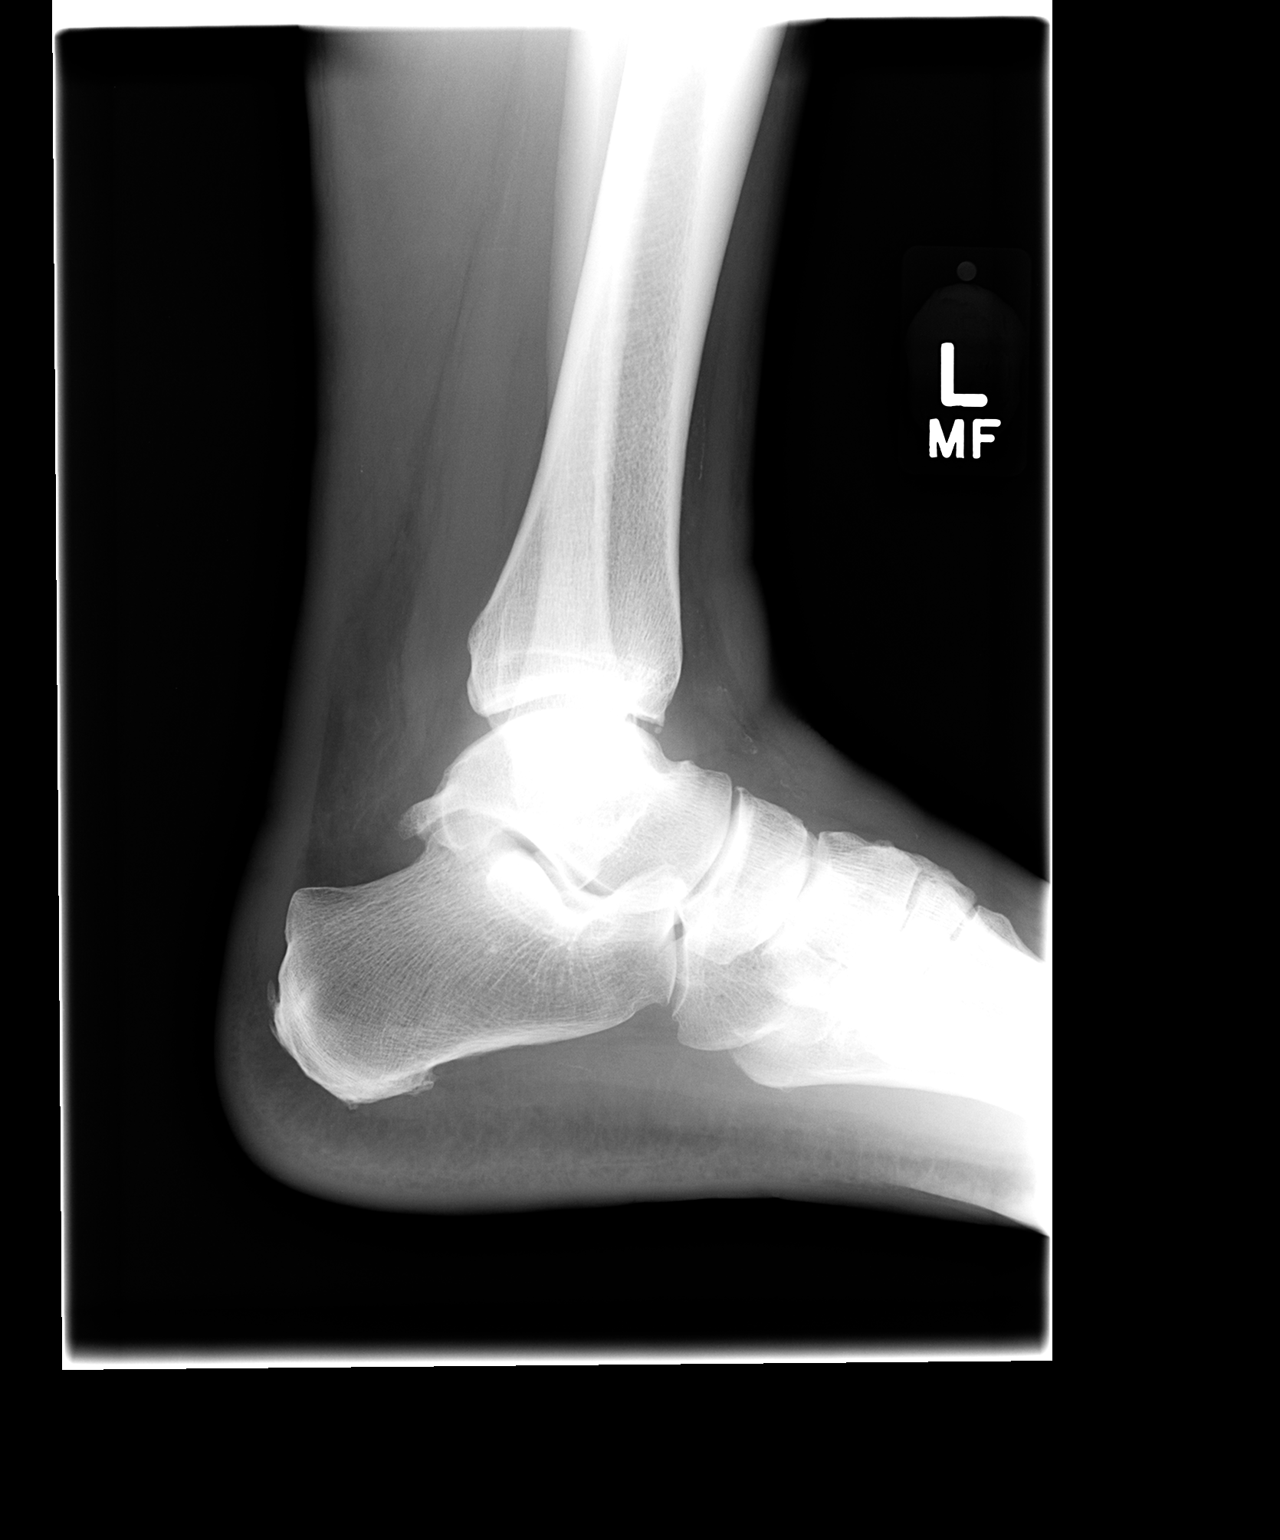

[3 of 3 positions shown; findings below may reference images not displayed]

FINDINGS: There is no acute fracture or dislocation. The ankle mortise is
intact. There is a small marginal osteophyte along the anterior lip
of the distal tibia. There is severe soft tissue swelling overlying
the lateral malleolus.
IMPRESSION: No acute osseous injury of the left ankle.
